# Patient Record
Sex: Male | Born: 1970 | Race: Black or African American | Hispanic: No | Marital: Single | Smoking: Never smoker
Health system: Southern US, Community
[De-identification: ages and names within clinical notes are randomized; demographics above are authoritative.]

## PROBLEM LIST (undated history)

## (undated) DIAGNOSIS — I10 Essential (primary) hypertension: Secondary | ICD-10-CM

---

## 2010-09-28 ENCOUNTER — Emergency Department (HOSPITAL_BASED_OUTPATIENT_CLINIC_OR_DEPARTMENT_OTHER): Admission: EM | Admit: 2010-09-28 | Discharge: 2010-09-28 | Payer: Self-pay | Admitting: Emergency Medicine

## 2010-09-28 ENCOUNTER — Ambulatory Visit: Payer: Self-pay | Admitting: Diagnostic Radiology

## 2011-01-21 LAB — COMPREHENSIVE METABOLIC PANEL
ALT: 26 U/L (ref 0–53)
AST: 27 U/L (ref 0–37)
Alkaline Phosphatase: 83 U/L (ref 39–117)
CO2: 25 mEq/L (ref 19–32)
Chloride: 106 mEq/L (ref 96–112)
GFR calc Af Amer: >60 mL/min (ref >60)
GFR calc non Af Amer: >60 mL/min (ref >60)
Glucose, Bld: 91 mg/dL (ref 70–99)
Sodium: 141 mEq/L (ref 135–145)
Total Bilirubin: 0.5 mg/dL (ref 0.3–1.2)

## 2011-01-21 LAB — CBC
HCT: 41.4 % (ref 39.0–52.0)
Hemoglobin: 14 g/dL (ref 13.0–17.0)
RBC: 4.93 MIL/uL (ref 4.22–5.81)

## 2011-01-21 LAB — POCT CARDIAC MARKERS
CKMB, poc: <1 ng/mL — ABNORMAL LOW (ref 1.0–8.0)
Troponin i, poc: <0.05 ng/mL (ref 0.00–0.09)
Troponin i, poc: <0.05 ng/mL (ref 0.00–0.09)

## 2011-01-21 LAB — DIFFERENTIAL
Basophils Absolute: 0 10*3/uL (ref 0.0–0.1)
Basophils Relative: 0 % (ref 0–1)
Eosinophils Absolute: 0.1 10*3/uL (ref 0.0–0.7)
Eosinophils Relative: 1 % (ref 0–5)

## 2011-01-21 LAB — D-DIMER, QUANTITATIVE: D-Dimer, Quant: <0.22 ug/mL-FEU (ref 0.00–0.48)

## 2012-05-06 ENCOUNTER — Encounter (HOSPITAL_BASED_OUTPATIENT_CLINIC_OR_DEPARTMENT_OTHER): Payer: Self-pay | Admitting: Emergency Medicine

## 2012-05-06 ENCOUNTER — Emergency Department (HOSPITAL_BASED_OUTPATIENT_CLINIC_OR_DEPARTMENT_OTHER)
Admission: EM | Admit: 2012-05-06 | Discharge: 2012-05-07 | Discharge status: Against Medical Advice | Payer: BC Managed Care – PPO | Attending: Emergency Medicine | Admitting: Emergency Medicine

## 2012-05-06 DIAGNOSIS — M79609 Pain in unspecified limb: Secondary | ICD-10-CM | POA: Insufficient documentation

## 2012-05-06 HISTORY — DX: Essential (primary) hypertension: I10

## 2012-05-06 NOTE — ED Notes (Signed)
Developed right arm pain describes pain as shooting/throbbing pain, pt lifts weights,

## 2012-05-07 NOTE — ED Notes (Signed)
Pt is alert and oriented.  Walks with steady gait.  NAD.

## 2012-07-27 ENCOUNTER — Emergency Department (HOSPITAL_BASED_OUTPATIENT_CLINIC_OR_DEPARTMENT_OTHER)
Admission: EM | Admit: 2012-07-27 | Discharge: 2012-07-27 | Disposition: A | Discharge status: Home / Self Care | Payer: BC Managed Care – PPO | Attending: Emergency Medicine | Admitting: Emergency Medicine

## 2012-07-27 ENCOUNTER — Encounter (HOSPITAL_BASED_OUTPATIENT_CLINIC_OR_DEPARTMENT_OTHER): Payer: Self-pay | Admitting: *Deleted

## 2012-07-27 ENCOUNTER — Emergency Department (HOSPITAL_BASED_OUTPATIENT_CLINIC_OR_DEPARTMENT_OTHER): Payer: BC Managed Care – PPO

## 2012-07-27 DIAGNOSIS — R202 Paresthesia of skin: Secondary | ICD-10-CM

## 2012-07-27 DIAGNOSIS — I10 Essential (primary) hypertension: Secondary | ICD-10-CM | POA: Insufficient documentation

## 2012-07-27 DIAGNOSIS — R209 Unspecified disturbances of skin sensation: Secondary | ICD-10-CM | POA: Insufficient documentation

## 2012-07-27 LAB — BASIC METABOLIC PANEL
Chloride: 104 mEq/L (ref 96–112)
Creatinine, Ser: 1.1 mg/dL (ref 0.50–1.35)
GFR calc Af Amer: >90 mL/min (ref >90)
Sodium: 137 mEq/L (ref 135–145)

## 2012-07-27 MED ORDER — NAPROXEN 500 MG PO TABS: 500.0000 mg | ORAL_TABLET | Freq: Two times a day (BID) | ORAL | Status: AC

## 2012-07-27 NOTE — ED Provider Notes (Signed)
History     CSN: [id number] Arrival date & time 07/27/12  1842 First MD Initiated Contact with Patient 07/27/12 1857     Chief Complaint  Patient presents with  . Chest Pain   HPI Patient states she's been having pain in his chest and left shoulder for the last 2 weeks.  An aching-type discomfort. He does not notice anything that makes it particularly better or worse. Patient states she also started to have some numbness in his left hand and fingers. He is not having any trouble with his grip or coordination. He has not noticed any issues with his speech balance or weakness in his legs. Patient denies any fevers or coughing. He has not had any vomiting or diarrhea. Past Medical History  Diagnosis Date  . Hypertension     History reviewed. No pertinent past surgical history.  No family history on file.  History  Substance Use Topics  . Smoking status: Never Smoker   . Smokeless tobacco: Not on file  . Alcohol Use: Yes     ocassionally      Review of Systems  All other systems reviewed and are negative.    Allergies  Review of patient's allergies indicates no known allergies.  Home Medications   Current Outpatient Rx  Name Route Sig Dispense Refill  . LISINOPRIL 20 MG PO TABS Oral Take 20 mg by mouth daily.      BP 155/89  Pulse 90  Temp 98.2 F (36.8 C) (Oral)  Resp 20  SpO2 100%  Physical Exam  Nursing note and vitals reviewed. Constitutional: He appears well-developed and well-nourished. No distress.  HENT:  Head: Normocephalic and atraumatic.  Right Ear: External ear normal.  Left Ear: External ear normal.  Eyes: Conjunctivae normal are normal. Right eye exhibits no discharge. Left eye exhibits no discharge. No scleral icterus.  Neck: Neck supple. No tracheal deviation present.  Cardiovascular: Normal rate, regular rhythm and intact distal pulses.   Pulmonary/Chest: Effort normal and breath sounds normal. No stridor. No respiratory distress. He has no  wheezes. He has no rales.  Abdominal: Soft. Bowel sounds are normal. He exhibits no distension. There is no tenderness. There is no rebound and no guarding.  Musculoskeletal: He exhibits no edema and no tenderness.       Left shoulder: Normal. He exhibits normal range of motion and no tenderness.       Left elbow: Normal.       Left wrist: Normal.       Cervical back: Normal.  Lymphadenopathy:    He has no cervical adenopathy.  Neurological: He is alert. He has normal strength. No sensory deficit. Cranial nerve deficit:  no gross defecits noted. He exhibits normal muscle tone. He displays no seizure activity. Coordination normal.       5 out of 5 grip strength, normal sensation in hands, normal gait  Skin: Skin is warm and dry. No rash noted.  Psychiatric: He has a normal mood and affect.    ED Course  Procedures (including critical care time)  Rate: 86  Rhythm: normal sinus rhythm  QRS Axis: normal  Intervals: normal  ST/T Wave abnormalities: normal  Conduction Disutrbances:none  Narrative Interpretation:   Old EKG Reviewed: none available  Labs Reviewed  BASIC METABOLIC PANEL - Abnormal; Notable for the following:    Glucose, Bld 111 (*)     GFR calc non Af Amer 83 (*)     All other components within normal limits  Dg Chest 2 View  07/27/2012  *RADIOLOGY REPORT*  Clinical Data: 41 year old male left chest pain radiating to the shoulder and neck.  CHEST - 2 VIEW  Comparison: 09/28/2010.  Findings: Stable, normal lung volumes.  Cardiac size and mediastinal contours are within normal limits.  Visualized tracheal air column is within normal limits.  Lung parenchyma stable and clear.  No pneumothorax or effusion.  IMPRESSION: No acute cardiopulmonary abnormality.   Original Report Authenticated By: Harley Hallmark, M.D.    Dg Cervical Spine Complete  07/27/2012  *RADIOLOGY REPORT*  Clinical Data: 41 year old male pain radiating to the left neck chest and shoulder.  No known injury.   CERVICAL SPINE - COMPLETE 4+ VIEW  Comparison: None.  Findings: Straightening of cervical lordosis.  C1-C2 alignment and odontoid within normal limits.  AP alignment within normal limits. Lung apices are clear. Cervicothoracic junction alignment is within normal limits.  Bilateral posterior element alignment is within normal limits.  Relatively preserved disc spaces.  IMPRESSION: No acute osseous abnormality in the cervical spine.   Original Report Authenticated By: Ulla Potash III, M.D.      1. Paresthesia        patient's symptoms are not suggestive of acute cardiac abnormality. He does not have any electrolyte abnormalities to suggest an etiology for his paresthesia. Suspect he may be having some type of nerve impingement. We'll discharge him home on a course of anti-inflammatory medications and recommend he followup with primary-care Dr. to if the symptoms persist he may benefit from further testing such as MRI  Celene Kras, MD 07/27/12 2042

## 2012-07-27 NOTE — ED Notes (Signed)
Pain in his left chest and left shoulder x 2 weeks. No known injury. Fingers on his left hand are numb.

## 2012-07-27 NOTE — ED Notes (Signed)
MD at bedside. 

## 2013-05-04 IMAGING — CR DG CERVICAL SPINE COMPLETE 4+V
7 series · 7 of 7 positions shown · non-contrast
Comparison: None.

CLINICAL DATA: 41-year-old male pain radiating to the left neck
chest and shoulder.  No known injury.

CERVICAL SPINE - COMPLETE 4+ VIEW

[w c-spine lat]
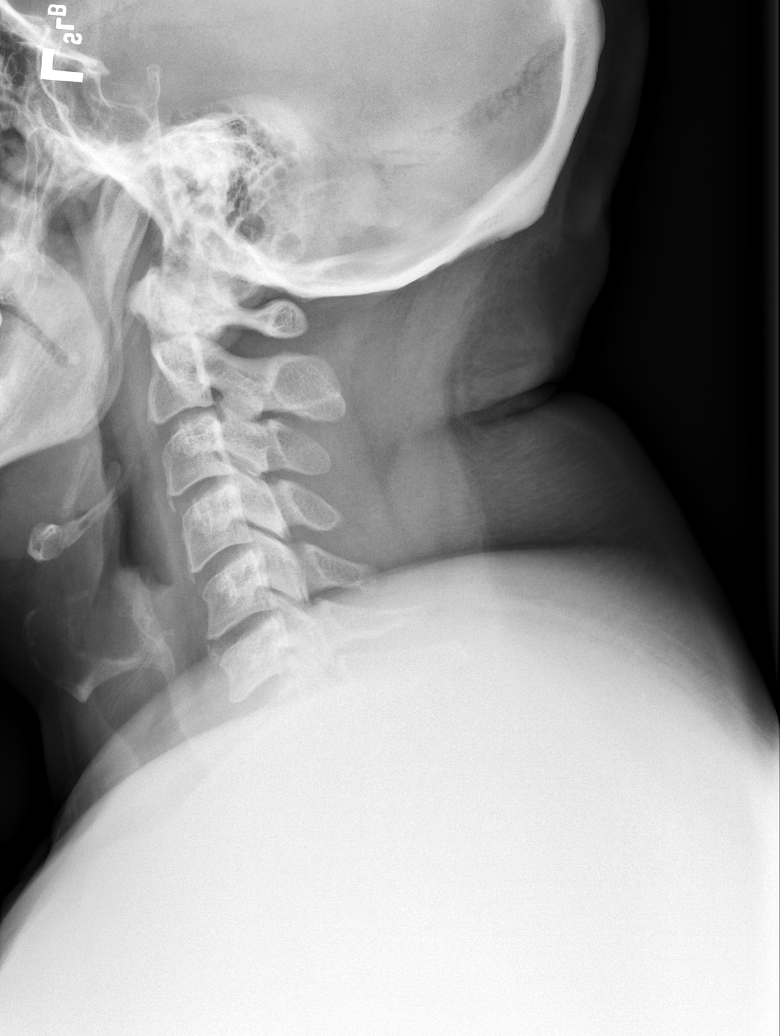

[w c-spine oblique (1 of 2)]
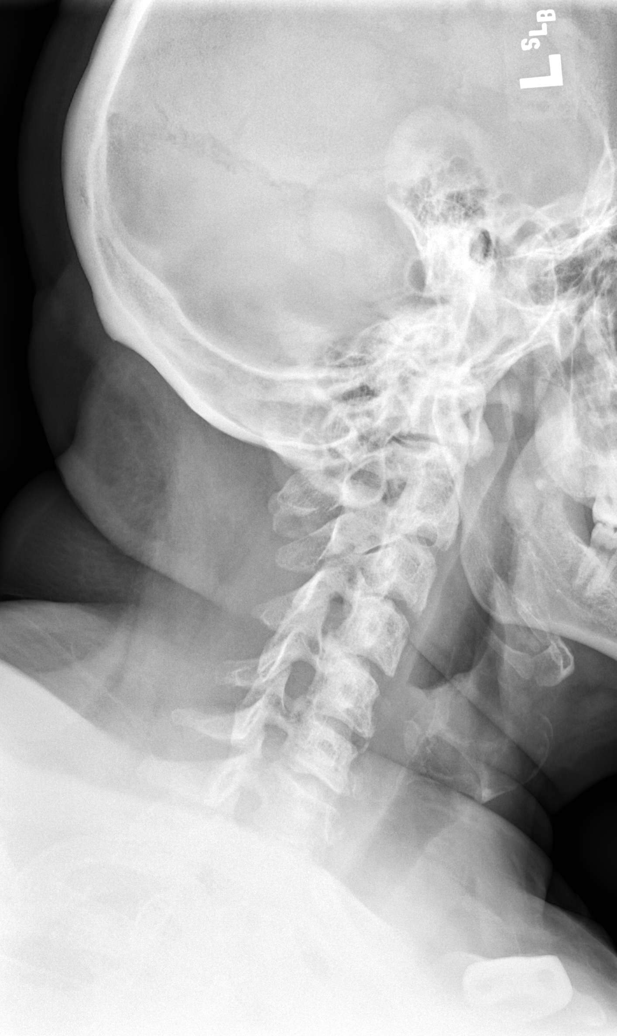

[w c-spine oblique (2 of 2)]
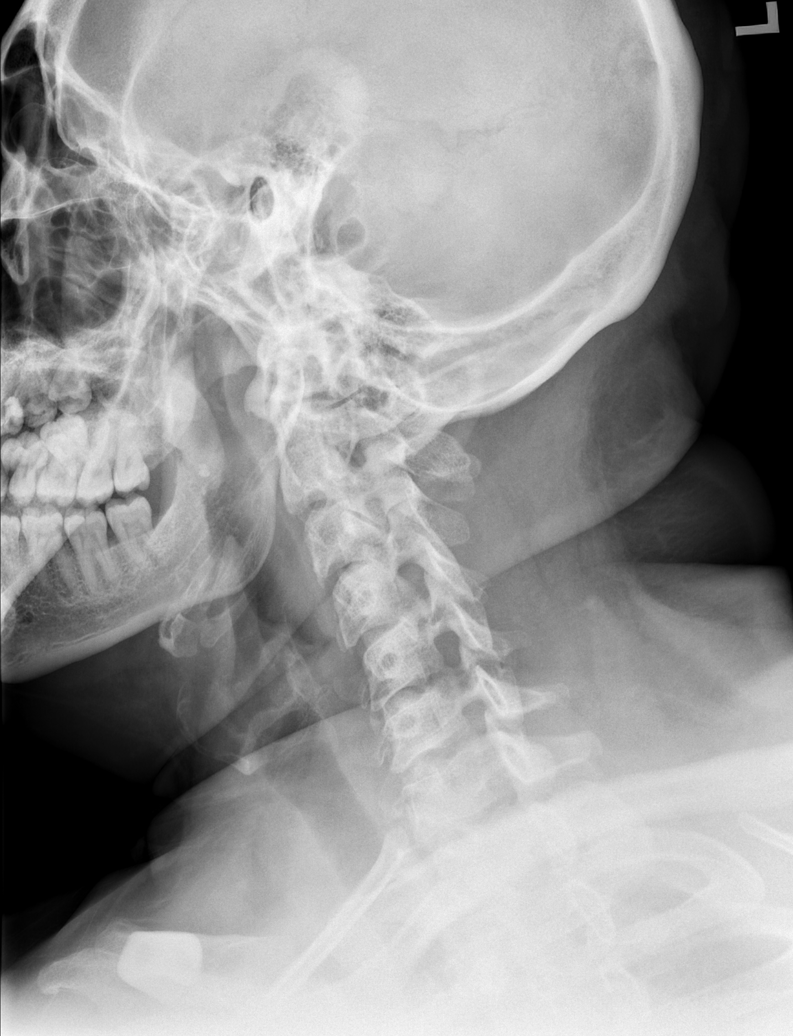

[w c-spine a.p.]
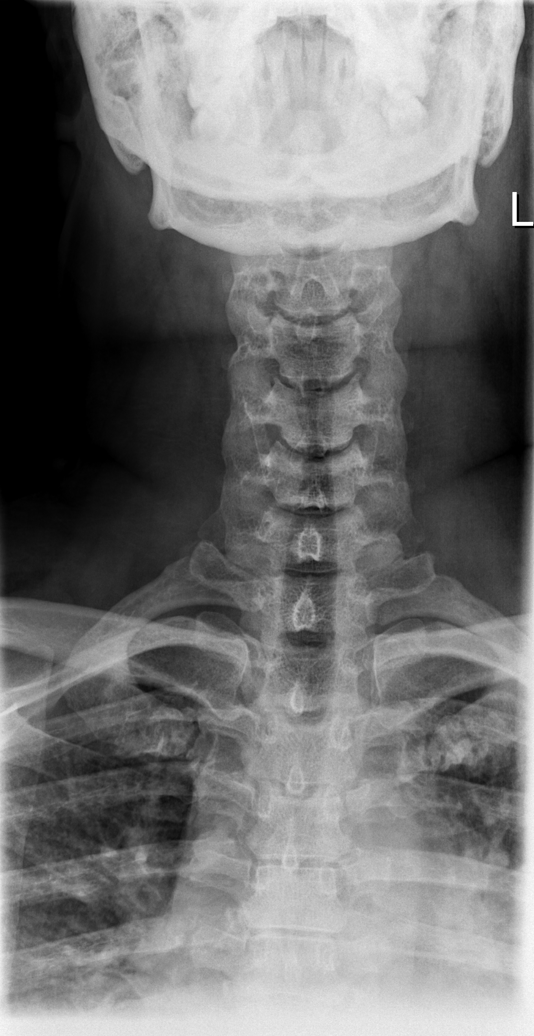

[w c-spine odontoid (1 of 2)]
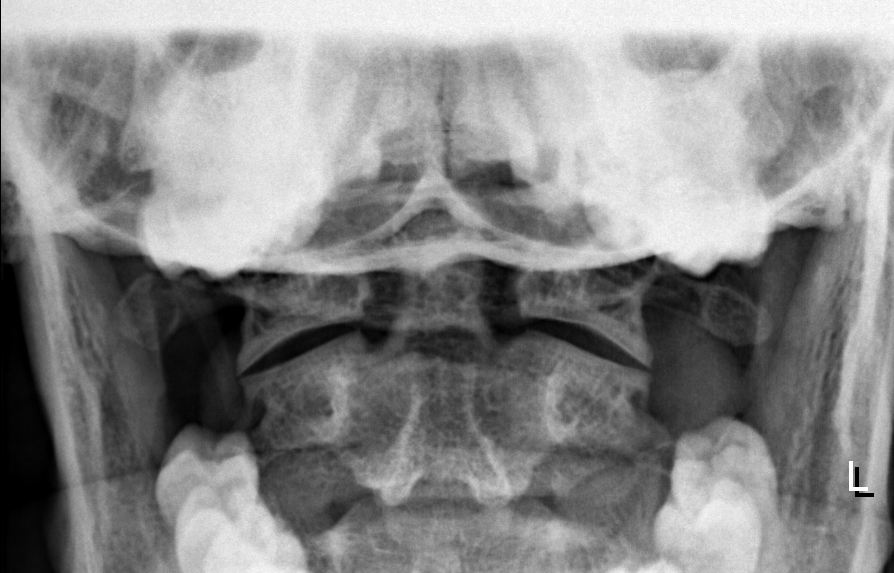

[w c-spine odontoid (2 of 2)]
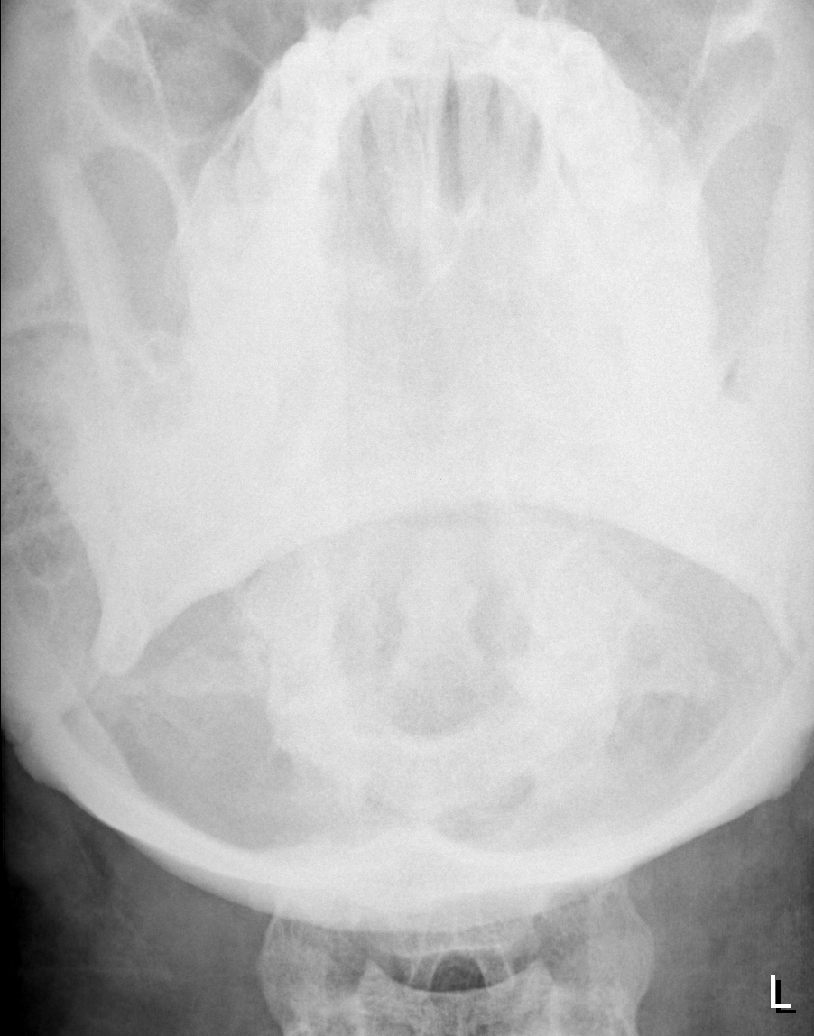

[w swimmers view *]
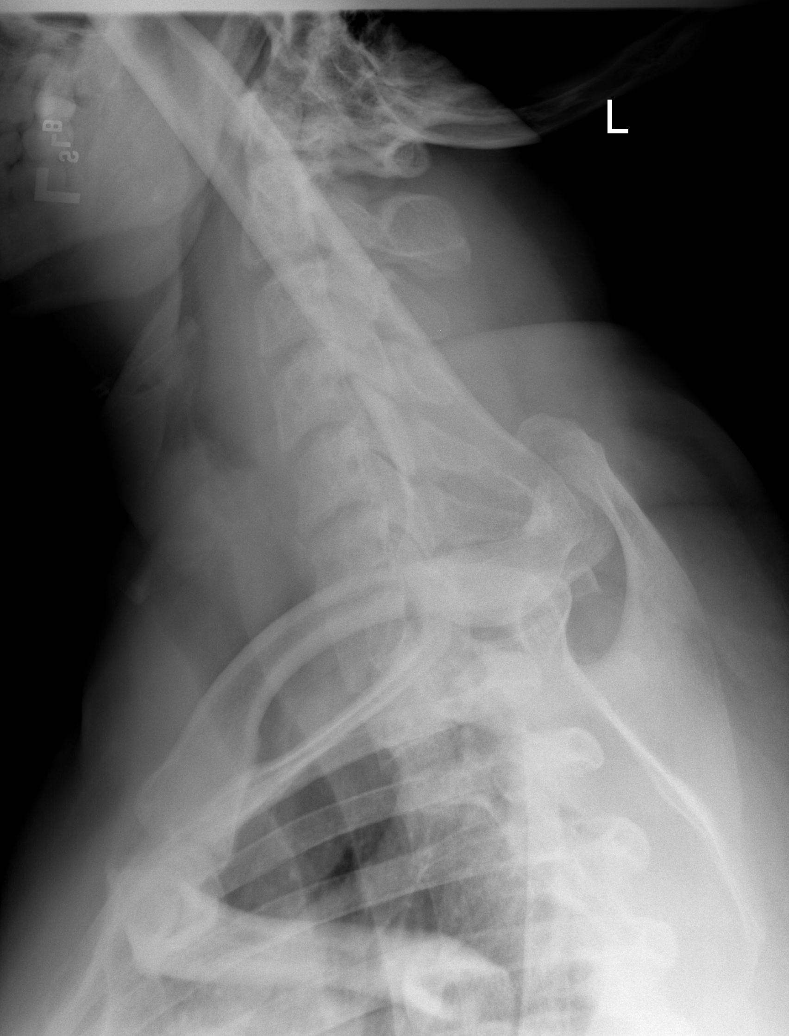

[7 of 7 positions shown; findings below may reference images not displayed]

FINDINGS: Straightening of cervical lordosis.  C1-C2 alignment and
odontoid within normal limits.  AP alignment within normal limits.
Lung apices are clear. Cervicothoracic junction alignment is within
normal limits.  Bilateral posterior element alignment is within
normal limits.  Relatively preserved disc spaces.
IMPRESSION: No acute osseous abnormality in the cervical spine.

## 2013-05-04 IMAGING — CR DG CHEST 2V
2 series · 2 of 2 positions shown · non-contrast
Comparison: 09/28/2010.

CLINICAL DATA: 41-year-old male left chest pain radiating to the
shoulder and neck.

CHEST - 2 VIEW

[w chest pa]
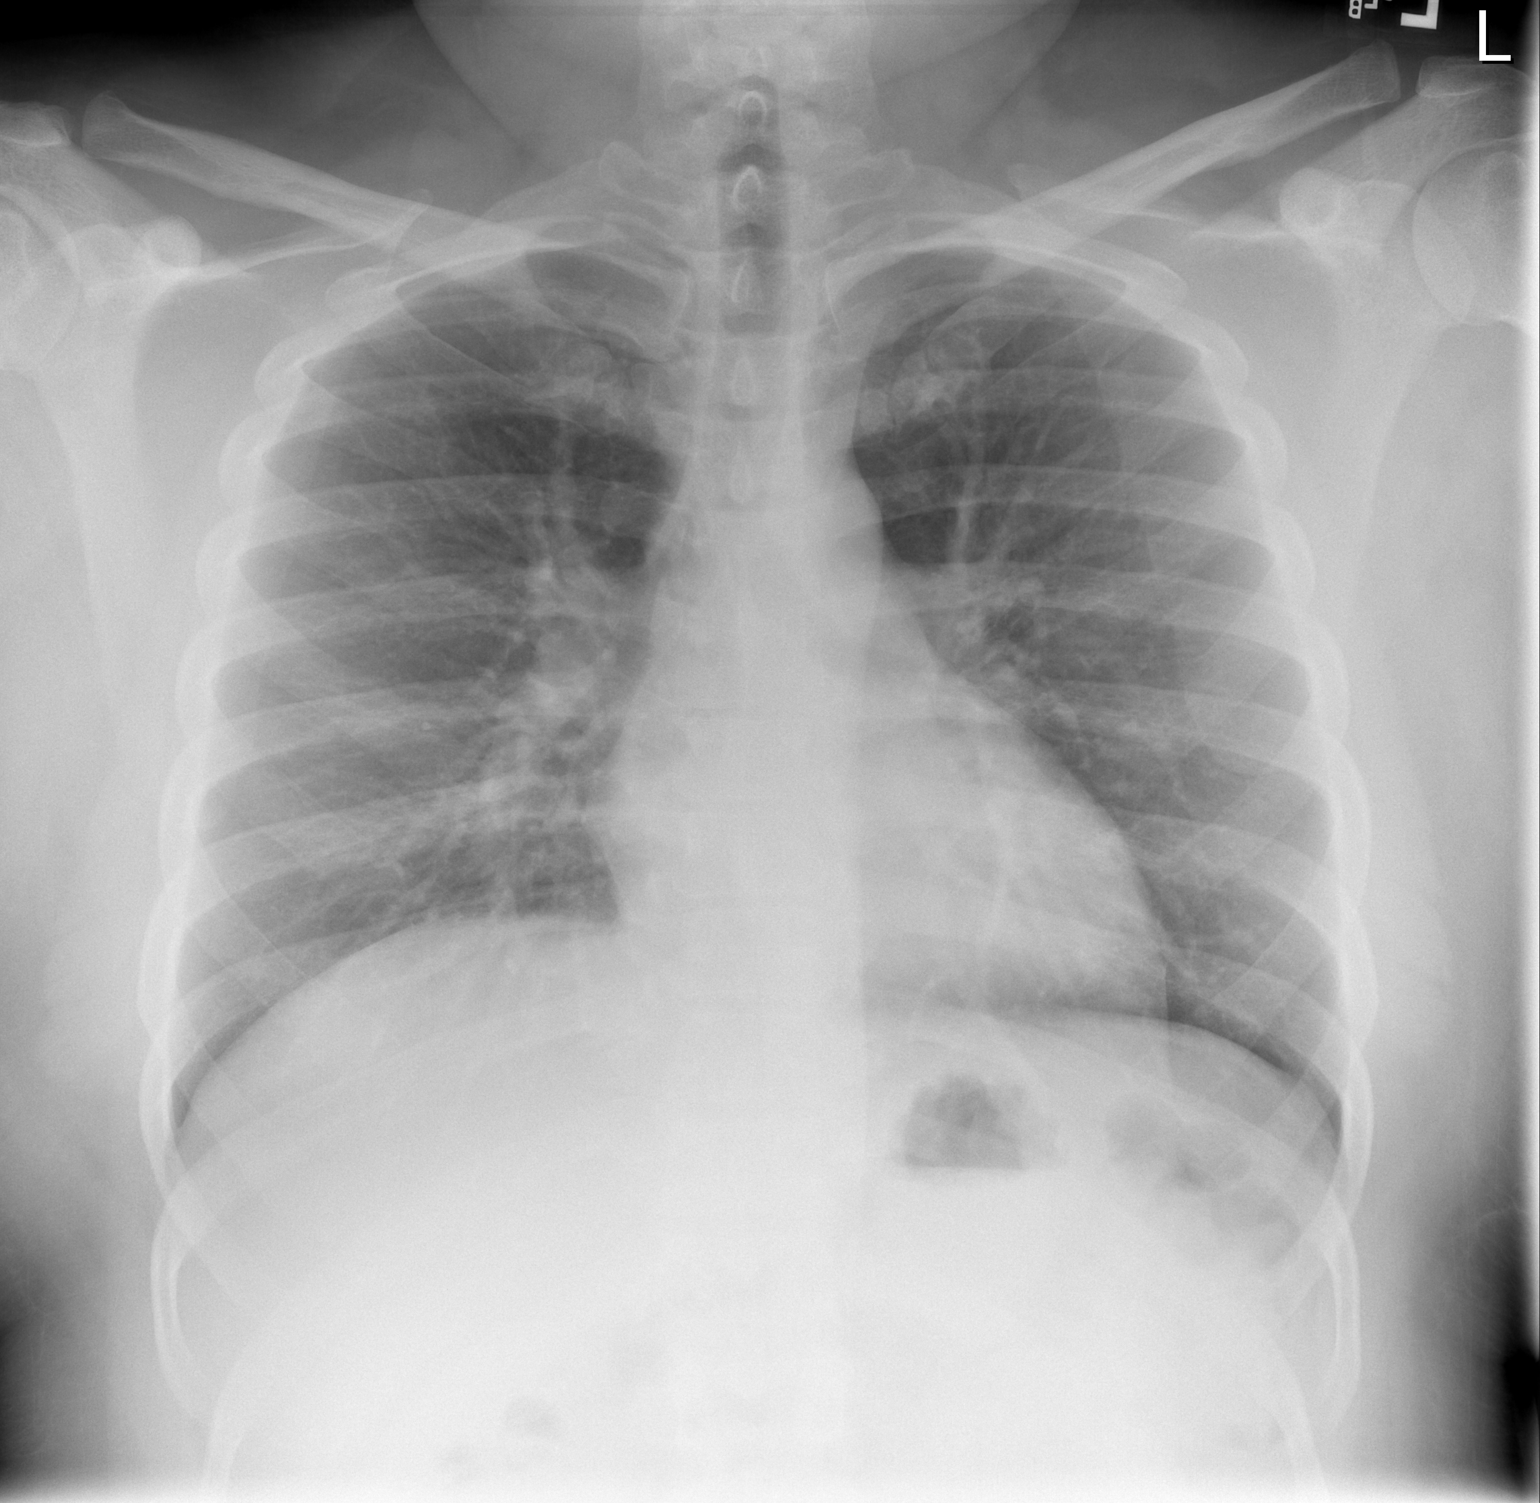

[w chest lat]
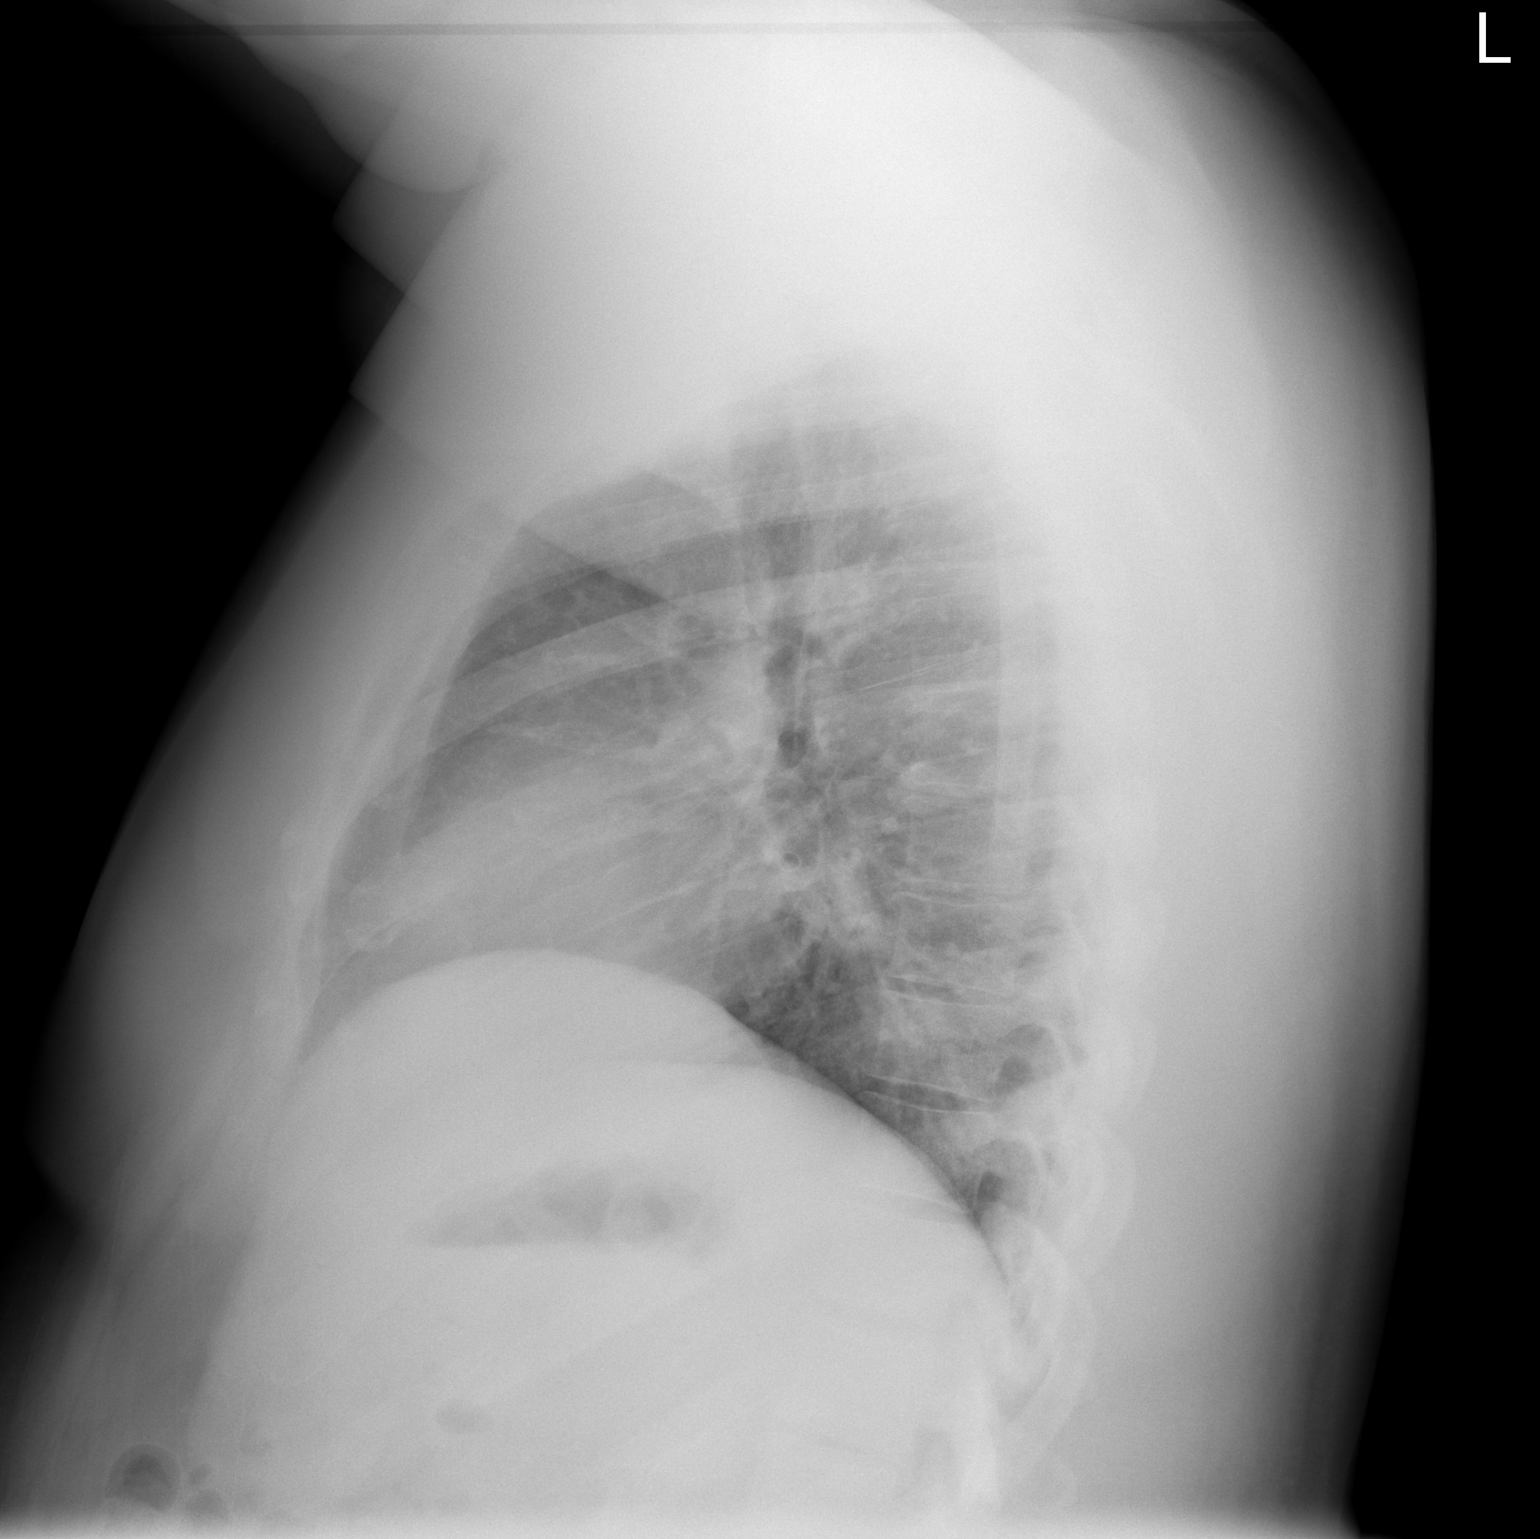

[2 of 2 positions shown; findings below may reference images not displayed]

FINDINGS: Stable, normal lung volumes.  Cardiac size and
mediastinal contours are within normal limits.  Visualized tracheal
air column is within normal limits.  Lung parenchyma stable and
clear.  No pneumothorax or effusion.
IMPRESSION: No acute cardiopulmonary abnormality.

## 2021-09-12 ENCOUNTER — Other Ambulatory Visit: Payer: Self-pay

## 2021-09-12 ENCOUNTER — Emergency Department (HOSPITAL_BASED_OUTPATIENT_CLINIC_OR_DEPARTMENT_OTHER)
Admission: EM | Admit: 2021-09-12 | Discharge: 2021-09-12 | Disposition: A | Discharge status: Home / Self Care | Payer: 59 | Attending: Emergency Medicine | Admitting: Emergency Medicine

## 2021-09-12 DIAGNOSIS — Z79899 Other long term (current) drug therapy: Secondary | ICD-10-CM | POA: Insufficient documentation

## 2021-09-12 DIAGNOSIS — R000 Tachycardia, unspecified: Secondary | ICD-10-CM | POA: Insufficient documentation

## 2021-09-12 DIAGNOSIS — K644 Residual hemorrhoidal skin tags: Secondary | ICD-10-CM | POA: Insufficient documentation

## 2021-09-12 DIAGNOSIS — K625 Hemorrhage of anus and rectum: Secondary | ICD-10-CM | POA: Diagnosis present

## 2021-09-12 DIAGNOSIS — K649 Unspecified hemorrhoids: Secondary | ICD-10-CM

## 2021-09-12 DIAGNOSIS — I10 Essential (primary) hypertension: Secondary | ICD-10-CM | POA: Insufficient documentation

## 2021-09-12 LAB — OCCULT BLOOD X 1 CARD TO LAB, STOOL: Fecal Occult Bld: POSITIVE — AB

## 2021-09-12 LAB — COMPREHENSIVE METABOLIC PANEL
ALT: 17 U/L (ref 0–44)
AST: 19 U/L (ref 15–41)
Albumin: 4.1 g/dL (ref 3.5–5.0)
Alkaline Phosphatase: 54 U/L (ref 38–126)
Anion gap: 9 (ref 5–15)
BUN: 12 mg/dL (ref 6–20)
CO2: 26 mmol/L (ref 22–32)
Calcium: 9.9 mg/dL (ref 8.9–10.3)
Chloride: 100 mmol/L (ref 98–111)
Creatinine, Ser: 1.01 mg/dL (ref 0.61–1.24)
GFR, Estimated: >60 mL/min (ref >60)
Glucose, Bld: 105 mg/dL — ABNORMAL HIGH (ref 70–99)
Potassium: 3.9 mmol/L (ref 3.5–5.1)
Sodium: 135 mmol/L (ref 135–145)
Total Bilirubin: 0.9 mg/dL (ref 0.3–1.2)
Total Protein: 7.6 g/dL (ref 6.5–8.1)

## 2021-09-12 LAB — CBC
HCT: 43.3 % (ref 39.0–52.0)
Hemoglobin: 14.8 g/dL (ref 13.0–17.0)
MCH: 29.1 pg (ref 26.0–34.0)
MCHC: 34.2 g/dL (ref 30.0–36.0)
MCV: 85.1 fL (ref 80.0–100.0)
Platelets: 293 10*3/uL (ref 150–400)
RBC: 5.09 MIL/uL (ref 4.22–5.81)
RDW: 13.7 % (ref 11.5–15.5)
WBC: 9.3 10*3/uL (ref 4.0–10.5)
nRBC: 0 % (ref 0.0–0.2)

## 2021-09-12 MED ORDER — HYDROCORTISONE ACETATE 25 MG RE SUPP: 25.0000 mg | Freq: Two times a day (BID) | RECTAL | 0 refills | Status: DC

## 2021-09-12 MED ORDER — LACTATED RINGERS IV BOLUS: 1000.0000 mL | Freq: Once | INTRAVENOUS | Status: DC

## 2021-09-12 MED ORDER — HYDROCORTISONE ACETATE 25 MG RE SUPP: 25.0000 mg | Freq: Two times a day (BID) | RECTAL | 0 refills | Status: AC

## 2021-09-12 NOTE — Discharge Instructions (Addendum)
Dear Jamie Long,  Today we evaluated you for rectal bleeding.  Based off of the physical exam findings we feel that it is likely bleeding related to an external hemorrhoid.  We are reassured that you are not having abdominal pain in your blood counts are within normal limits so there is no concern for acute anemia. We will prescribe some Anusol to use for symptom relief.  You can take over-the-counter pain medications if needed.  We will also give you some absorbent materials to use so that you do not saturate her clothing.  We recommend that you follow-up with the [facility] general surgery practice for evaluation of these hemorrhoids.  Their number is [phone]. If you notice continued bleeding or if it significantly worsens and you begin to experience symptoms of anemia such as dizziness or lightheadedness please return to the emergency department.

## 2021-09-12 NOTE — ED Notes (Signed)
Extra Tubes Drawn B,R,G

## 2021-09-12 NOTE — ED Notes (Signed)
Hemoccult completed by EDP and witnessed by this Clinical research associate.

## 2021-09-12 NOTE — ED Provider Notes (Signed)
[facility] EMERGENCY DEPARTMENT Provider Note   CSN: [id number] Arrival date & time: 09/12/21  9201     History Chief Complaint  Patient presents with   Rectal Bleeding    Jamie Long is a 50 y.o. male. With hx alcohol use who presents with complaints of new onset rectal bleeding for the past two days with pain beginning approximately 4 days ago. Notices blood when wiping. No blood mixed in stool and no melanotic stool. No bloody diarrhea. Having normal bowel movements otherwise. Not having abdominal pain, but does endorse rectal pain and sensitivity when wiping. He denies nausea or vomiting. Reports no family history of GI disease. He has not had a colonoscopy or endocscopy. Endorses drinking more than 10 alcoholic drinks every other weekend. No other complaints at this time.   Rectal Bleeding Associated symptoms: no abdominal pain and no vomiting       Past Medical History:  Diagnosis Date   Hypertension     There are no problems to display for this patient.   No past surgical history on file.     No family history on file.  Social History   Tobacco Use   Smoking status: Never  Substance Use Topics   Alcohol use: Yes    Comment: ocassionally   Drug use: No    Home Medications Prior to Admission medications   Medication Sig Start Date End Date Taking? Authorizing Provider  hydrocortisone (ANUSOL-HC) 25 MG suppository Place 1 suppository (25 mg total) rectally 2 (two) times daily. 09/12/21   Tegeler, Canary Brim, MD  lisinopril (PRINIVIL,ZESTRIL) 20 MG tablet Take 20 mg by mouth daily.    [provider]  naproxen (NAPROSYN) 500 MG tablet Take 1 tablet (500 mg total) by mouth 2 (two) times daily. 07/27/12   Linwood Dibbles, MD    Allergies    Patient has no known allergies.  Review of Systems   Review of Systems  Constitutional: Negative.   HENT: Negative.    Respiratory: Negative.    Cardiovascular: Negative.   Gastrointestinal:   Positive for anal bleeding, blood in stool, hematochezia and rectal pain. Negative for abdominal distention, abdominal pain, constipation, diarrhea, nausea and vomiting.  Endocrine: Negative.   Genitourinary: Negative.   Musculoskeletal: Negative.   Skin: Negative.   Neurological: Negative.   Hematological: Negative.   Psychiatric/Behavioral: Negative.     Physical Exam Updated Vital Signs BP (!) 141/76   Pulse 97   Temp 98.4 F (36.9 C) (Oral)   Resp 18   Ht 5\' 11"  (1.803 m)   Wt (!) 138.3 kg   SpO2 99%   BMI 42.54 kg/m   Physical Exam Constitutional:      General: He is not in acute distress.    Appearance: Normal appearance. He is obese. He is not ill-appearing, toxic-appearing or diaphoretic.  HENT:     Head: Normocephalic and atraumatic.  Cardiovascular:     Rate and Rhythm: Regular rhythm. Tachycardia present.     Pulses: Normal pulses.     Heart sounds: Normal heart sounds. No murmur heard. Pulmonary:     Effort: Pulmonary effort is normal.     Breath sounds: Normal breath sounds.  Abdominal:     General: Abdomen is flat. Bowel sounds are normal. There is no distension.     Palpations: Abdomen is soft. There is no mass.     Tenderness: There is no abdominal tenderness. There is no right CVA tenderness, left CVA tenderness, guarding or rebound.  Hernia: No hernia is present.     Comments: Thrombosed hemorrhoid noted with blood around perianal region. No internal masses, fissures, or tears noted on exam.  Genitourinary:    Rectum: Guaiac result positive.  Musculoskeletal:        General: Normal range of motion.  Skin:    General: Skin is warm and dry.  Neurological:     General: No focal deficit present.     Mental Status: He is alert and oriented to person, place, and time. Mental status is at baseline.  Psychiatric:        Mood and Affect: Mood normal.        Behavior: Behavior normal.        Thought Content: Thought content normal.        Judgment:  Judgment normal.    ED Results / Procedures / Treatments   Labs (all labs ordered are listed, but only abnormal results are displayed) Labs Reviewed  COMPREHENSIVE METABOLIC PANEL - Abnormal; Notable for the following components:      Result Value   Glucose, Bld 105 (*)    All other components within normal limits  OCCULT BLOOD X 1 CARD TO LAB, STOOL - Abnormal; Notable for the following components:   Fecal Occult Bld POSITIVE (*)    All other components within normal limits  CBC    EKG None  Radiology No results found.  Procedures Procedures   Medications Ordered in ED Medications - No data to display  ED Course  I have reviewed the triage vital signs and the nursing notes.  Pertinent labs & imaging results that were available during my care of the patient were reviewed by me and considered in my medical decision making (see chart for details).    MDM Rules/Calculators/A&P                            50 year old male presenting with new onset rectal bleeding that he has noticed for the past couple days.  He denies any melanotic stools and no blood mixed in the stools, however he has been noticing significant amount of blood when he wipes.  No bloody diarrhea either.  He is not having any abdominal pain however he does endorse rectal pain and sensitivity.   On physical exam no abdominal tenderness bowel sounds normal.  Rectal exam was performed revealed external hemorrhoids no palpable internal hemorrhoids or anal fissures.  Given absence of abdominal tenderness, no concern for diverticular bleed or bowel necrosis at this point either.  Do not feel imaging of his abdomen would be contributory at this point.  He was however noted to be mildly tachycardic, but vitals are stable and did not feel the need to give fluids at this point.  CBC was obtained to evaluate for anemia which fortunately returned normal.  He does report a history of significant alcohol use drinking greater  than 10 alcoholic drinks every other week and for several years.  However, despite this, he is not reporting any hematemesis, no melanotic stool.  He is not having any nausea or vomiting.  He denies any family history of gastrointestinal disease.  He has not had a colonoscopy or endoscopy and will likely need to follow-up with a gastroenterologist as an outpatient to have this done. He will be treated for thrombosed external hemorrhoids.  Given prescription for Anusol and pads to assist with soaking up blood.  Advised over-the-counter pain control medication and  that he follow-up with a general surgeon.  Patient was given number to call.  Strict return precautions discussed with the patient involving symptoms of anemia.  Final Clinical Impression(s) / ED Diagnoses Final diagnoses:  None    Rx / DC Orders ED Discharge Orders          Ordered    hydrocortisone (ANUSOL-HC) 25 MG suppository  2 times daily,   Status:  Discontinued        09/12/21 1349    hydrocortisone (ANUSOL-HC) 25 MG suppository  2 times daily        09/12/21 1408             Adron Bene, MD 09/12/21 2344    Tegeler, Canary Brim, MD 09/13/21 [phone]

## 2021-09-12 NOTE — ED Triage Notes (Signed)
Pt states noticed blood in underwear yesterday. Denies hx of hemorrhoids. States no abdominal pain. C/o rectal pain. States large amount of blood coming from rectum.
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
## 2024-05-11 NOTE — Telephone Encounter (Signed)
 Patient called office complaining of a new onset rash to bilateral arms. States it consists of itchy red bumps. Denies difficulty breathing, shortness of breath, chest pain, racing heart, facial swelling, or difficulty swallowing. He was started on Doxycycline yesterday and had first dose at 10:30 05/10/2024, second dose last evening, and third dose this morning. Denies any other new things or being in brush or wooded area. Pt with NKDA. Instructed patient to discontinue the medication. To start otc Loratadine during the day and Benadryl at night. May apply Hydrocortisone  cream to arms as needed for itching. Instructed not to place cream on face or genital area. Advised patient to seek emergency attention if he develops difficulty breathing, shortness of breath, chest pain, racing heart, facial swelling, or difficulty swallowing. Patient to keep his follow up appointment tomorrow and can address if a different antibiotic is required. Patient states understanding, agreeable with plan, and no further questions or concerns at this time.

## 2024-05-12 ENCOUNTER — Other Ambulatory Visit: Payer: Self-pay

## 2024-05-12 ENCOUNTER — Encounter (HOSPITAL_BASED_OUTPATIENT_CLINIC_OR_DEPARTMENT_OTHER): Payer: Self-pay | Admitting: Emergency Medicine

## 2024-05-12 ENCOUNTER — Emergency Department (HOSPITAL_BASED_OUTPATIENT_CLINIC_OR_DEPARTMENT_OTHER)
Admission: EM | Admit: 2024-05-12 | Discharge: 2024-05-12 | Disposition: A | Discharge status: Home / Self Care | Attending: Emergency Medicine | Admitting: Emergency Medicine

## 2024-05-12 DIAGNOSIS — Z79899 Other long term (current) drug therapy: Secondary | ICD-10-CM | POA: Diagnosis not present

## 2024-05-12 DIAGNOSIS — D649 Anemia, unspecified: Secondary | ICD-10-CM | POA: Diagnosis not present

## 2024-05-12 DIAGNOSIS — R21 Rash and other nonspecific skin eruption: Secondary | ICD-10-CM | POA: Diagnosis not present

## 2024-05-12 DIAGNOSIS — D72829 Elevated white blood cell count, unspecified: Secondary | ICD-10-CM | POA: Insufficient documentation

## 2024-05-12 DIAGNOSIS — I10 Essential (primary) hypertension: Secondary | ICD-10-CM | POA: Insufficient documentation

## 2024-05-12 DIAGNOSIS — Z4800 Encounter for change or removal of nonsurgical wound dressing: Secondary | ICD-10-CM | POA: Insufficient documentation

## 2024-05-12 DIAGNOSIS — E119 Type 2 diabetes mellitus without complications: Secondary | ICD-10-CM | POA: Diagnosis not present

## 2024-05-12 DIAGNOSIS — Z5189 Encounter for other specified aftercare: Secondary | ICD-10-CM

## 2024-05-12 LAB — COMPREHENSIVE METABOLIC PANEL WITH GFR
ALT: 15 U/L (ref 0–44)
AST: 22 U/L (ref 15–41)
Albumin: 4 g/dL (ref 3.5–5.0)
Alkaline Phosphatase: 61 U/L (ref 38–126)
Anion gap: 10 (ref 5–15)
BUN: 18 mg/dL (ref 6–20)
CO2: 26 mmol/L (ref 22–32)
Calcium: 9.4 mg/dL (ref 8.9–10.3)
Chloride: 99 mmol/L (ref 98–111)
Creatinine, Ser: 1.27 mg/dL — ABNORMAL HIGH (ref 0.61–1.24)
GFR, Estimated: >60 mL/min (ref >60)
Glucose, Bld: 110 mg/dL — ABNORMAL HIGH (ref 70–99)
Potassium: 4.3 mmol/L (ref 3.5–5.1)
Sodium: 136 mmol/L (ref 135–145)
Total Bilirubin: 0.5 mg/dL (ref 0.0–1.2)
Total Protein: 7.2 g/dL (ref 6.5–8.1)

## 2024-05-12 LAB — CBC WITH DIFFERENTIAL/PLATELET
Abs Immature Granulocytes: 0.03 10*3/uL (ref 0.00–0.07)
Basophils Absolute: 0 10*3/uL (ref 0.0–0.1)
Basophils Relative: 0 %
Eosinophils Absolute: 0.5 10*3/uL (ref 0.0–0.5)
Eosinophils Relative: 5 %
HCT: 46.3 % (ref 39.0–52.0)
Hemoglobin: 15.4 g/dL (ref 13.0–17.0)
Immature Granulocytes: 0 %
Lymphocytes Relative: 21 %
Lymphs Abs: 2.1 10*3/uL (ref 0.7–4.0)
MCH: 28.2 pg (ref 26.0–34.0)
MCHC: 33.3 g/dL (ref 30.0–36.0)
MCV: 84.6 fL (ref 80.0–100.0)
Monocytes Absolute: 0.8 10*3/uL (ref 0.1–1.0)
Monocytes Relative: 8 %
Neutro Abs: 6.5 10*3/uL (ref 1.7–7.7)
Neutrophils Relative %: 66 %
Platelets: 295 10*3/uL (ref 150–400)
RBC: 5.47 MIL/uL (ref 4.22–5.81)
RDW: 14.1 % (ref 11.5–15.5)
WBC: 9.9 10*3/uL (ref 4.0–10.5)
nRBC: 0 % (ref 0.0–0.2)

## 2024-05-12 LAB — URINALYSIS, W/ REFLEX TO CULTURE (INFECTION SUSPECTED)
Bilirubin Urine: NEGATIVE
Glucose, UA: NEGATIVE mg/dL
Hgb urine dipstick: NEGATIVE
Ketones, ur: NEGATIVE mg/dL
Leukocytes,Ua: NEGATIVE
Nitrite: NEGATIVE
Protein, ur: NEGATIVE mg/dL
Specific Gravity, Urine: 1.025 (ref 1.005–1.030)
pH: 5.5 (ref 5.0–8.0)
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
## 2024-05-12 NOTE — ED Notes (Signed)
 Was advised by provider to delay labs and blood draws.  Waiting to ensure all orders are added before proceeding.
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
